# Patient Record
Sex: Female | Born: 1965 | Smoking: Current some day smoker
Health system: Southern US, Community
[De-identification: ages and names within clinical notes are randomized; demographics above are authoritative.]

## PROBLEM LIST (undated history)

## (undated) DIAGNOSIS — Z8619 Personal history of other infectious and parasitic diseases: Secondary | ICD-10-CM

## (undated) DIAGNOSIS — E079 Disorder of thyroid, unspecified: Secondary | ICD-10-CM

## (undated) HISTORY — DX: Disorder of thyroid, unspecified: E07.9

## (undated) HISTORY — DX: Personal history of other infectious and parasitic diseases: Z86.19

## (undated) HISTORY — PX: CHOLECYSTECTOMY: SHX55

---

## 2016-12-11 DIAGNOSIS — Z1231 Encounter for screening mammogram for malignant neoplasm of breast: Secondary | ICD-10-CM | POA: Diagnosis not present

## 2016-12-11 DIAGNOSIS — R319 Hematuria, unspecified: Secondary | ICD-10-CM | POA: Diagnosis not present

## 2016-12-11 DIAGNOSIS — Z1211 Encounter for screening for malignant neoplasm of colon: Secondary | ICD-10-CM | POA: Diagnosis not present

## 2016-12-11 DIAGNOSIS — Z0000 Encounter for general adult medical examination without abnormal findings: Secondary | ICD-10-CM | POA: Diagnosis not present

## 2016-12-11 DIAGNOSIS — E039 Hypothyroidism, unspecified: Secondary | ICD-10-CM | POA: Diagnosis not present

## 2016-12-18 DIAGNOSIS — Z1212 Encounter for screening for malignant neoplasm of rectum: Secondary | ICD-10-CM | POA: Diagnosis not present

## 2016-12-18 DIAGNOSIS — Z1211 Encounter for screening for malignant neoplasm of colon: Secondary | ICD-10-CM | POA: Diagnosis not present

## 2017-03-18 DIAGNOSIS — Z1231 Encounter for screening mammogram for malignant neoplasm of breast: Secondary | ICD-10-CM | POA: Diagnosis not present

## 2018-01-03 ENCOUNTER — Encounter: Payer: Self-pay | Admitting: Family Medicine

## 2018-01-03 ENCOUNTER — Ambulatory Visit: Payer: BLUE CROSS/BLUE SHIELD | Admitting: Family Medicine

## 2018-01-03 VITALS — BP 122/82 | HR 82 | Ht 61.0 in | Wt 185.2 lb

## 2018-01-03 DIAGNOSIS — M799 Soft tissue disorder, unspecified: Secondary | ICD-10-CM | POA: Diagnosis not present

## 2018-01-03 DIAGNOSIS — R2231 Localized swelling, mass and lump, right upper limb: Secondary | ICD-10-CM

## 2018-01-03 DIAGNOSIS — M7989 Other specified soft tissue disorders: Secondary | ICD-10-CM

## 2018-01-03 NOTE — Progress Notes (Signed)
Patient ID: Holly Brewer, female   DOB: [birth year], 52 y.o.   MRN: [id number]    Holly Brewer - 52 y.o. female MRN [id number]  Date of birth: 11/16/1965  Subjective Chief Complaint  Patient presents with  . Soft tissue mass    HPI Holly Brewer is a 52 y.o. female here today to establish care with new pcp.  She has a history of hypothyroidism but has otherwise been healthy.  She is concerned about soft tissue mass on RUE and abdomen.  She reports that several weeks ago she had an area on her right arm that reddened and somewhat raised.  After a couple of days this area flattened out.  This areas has become hardened and mildly tender to palpation since that time.  Last week she noticed a similar area on her abdomen.  She denies any drainage from these area, increased swelling, fever, chills, weight change or other symptoms. She was in the [employer] but denies known significant exposure to known carcinogens such as water contamination at [employer].    ROS: ROS completed and negative except as noted per HPI.    No Known Allergies  Past Medical History:  Diagnosis Date  . History of chicken pox   . Thyroid disease     Past Surgical History:  Procedure Laterality Date  . CHOLECYSTECTOMY      Social History   Socioeconomic History  . Marital status: Unknown    Spouse name: Not on file  . Number of children: Not on file  . Years of education: Not on file  . Highest education level: Not on file  Occupational History  . Not on file  Social Needs  . Financial resource strain: Not on file  . Food insecurity:    Worry: Not on file    Inability: Not on file  . Transportation needs:    Medical: Not on file    Non-medical: Not on file  Tobacco Use  . Smoking status: Never Smoker  . Smokeless tobacco: Never Used  Substance and Sexual Activity  . Alcohol use: Yes    Comment: occass.   . Drug use: Never  . Sexual activity: Not on file  Lifestyle  . Physical activity:    Days per  week: Not on file    Minutes per session: Not on file  . Stress: Not on file  Relationships  . Social connections:    Talks on phone: Not on file    Gets together: Not on file    Attends religious service: Not on file    Active member of club or organization: Not on file    Attends meetings of clubs or organizations: Not on file    Relationship status: Not on file  Other Topics Concern  . Not on file  Social History Narrative  . Not on file    Family History  Problem Relation Age of Onset  . Diabetes Mother   . Birth defects Daughter   . Diabetes Maternal Grandfather   . Arthritis Paternal Grandmother   . Cancer Paternal Grandfather     Health Maintenance  Topic Date Due  . Hepatitis C Screening  01-31-1966  . PAP SMEAR  07/15/1985  . MAMMOGRAM  07/15/2014  . COLONOSCOPY  07/15/2014  . HIV Screening  01/04/2019 (Originally 07/16/1979)  . INFLUENZA VACCINE  03/06/2018  . TETANUS/TDAP  08/06/2025    ----------------------------------------------------------------------------------------------------------------------------------------------------------------------------------------------------------------- Physical Exam BP 122/82   Pulse 82   Ht  (1.549 m)  Wt 185 lb 3.2 oz (84 kg)   LMP  (LMP Unknown)   BMI 34.99 kg/m   Physical Exam  Constitutional: She is oriented to person, place, and time. She appears well-nourished. No distress.  HENT:  Mouth/Throat: Oropharynx is clear and moist.  Eyes: No scleral icterus.  Neck: Neck supple. No thyromegaly present.  Cardiovascular: Normal rate, regular rhythm and normal heart sounds.  Pulmonary/Chest: Effort normal and breath sounds normal.  Lymphadenopathy:    She has no cervical adenopathy.  Neurological: She is alert and oriented to person, place, and time.  Skin:  Ill defined ~2-3 cm fixed soft tissue mass.  Area is firm in consistency without fluctuance.   Area on abdomen is smaller ~1-2cm but similar in  makeup.    Psychiatric: She has a normal mood and affect. Her behavior is normal.    ------------------------------------------------------------------------------------------------------------------------------------------------------------------------------------------------------------------- Assessment and Plan  Soft tissue mass Unclear etiology, I have ordered an ultrasound of the soft tissue to better clarify these areas May require MRI if [place] isn't definitive.    She will f/u for CPE and fasting labs, will address health maintenance at this visit.

## 2018-01-03 NOTE — Assessment & Plan Note (Signed)
Unclear etiology, I have ordered an ultrasound of the soft tissue to better clarify these areas May require MRI if [place] isn't definitive.

## 2018-01-03 NOTE — Patient Instructions (Addendum)
It was very nice to meet you! You will be contacted for ultrasound scheduling Schedule a CPE with fasting labs with me

## 2018-01-09 ENCOUNTER — Other Ambulatory Visit: Payer: Self-pay | Admitting: Family Medicine

## 2018-01-09 ENCOUNTER — Ambulatory Visit
Admission: RE | Admit: 2018-01-09 | Discharge: 2018-01-09 | Disposition: A | Discharge status: Home / Self Care | Payer: BLUE CROSS/BLUE SHIELD | Source: Ambulatory Visit | Attending: Family Medicine | Admitting: Family Medicine

## 2018-01-09 DIAGNOSIS — M7989 Other specified soft tissue disorders: Secondary | ICD-10-CM

## 2018-01-09 DIAGNOSIS — R1900 Intra-abdominal and pelvic swelling, mass and lump, unspecified site: Secondary | ICD-10-CM

## 2018-01-09 DIAGNOSIS — R2231 Localized swelling, mass and lump, right upper limb: Principal | ICD-10-CM

## 2018-01-09 NOTE — Progress Notes (Signed)
Ultrasound suspicious for lipoma in R arm but indeterminate.  US of area on abdomen is indeterminate as well.  We can watch these areas for now to see if they change in size or get an MRI for better clarification of these areas.

## 2018-01-10 NOTE — Addendum Note (Signed)
Addended by: Mammie LorenzoMATTHEWS, Shomari Matusik E on: 01/10/2018 04:30 PM   Modules accepted: Orders

## 2018-01-20 ENCOUNTER — Other Ambulatory Visit: Payer: BLUE CROSS/BLUE SHIELD

## 2018-03-20 ENCOUNTER — Telehealth: Payer: Self-pay | Admitting: Family Medicine

## 2018-03-20 NOTE — Telephone Encounter (Signed)
Spoke with the, offer an nurse visit next week or blood work today or tomorrow.   Pt stated [facility], so [place].   FYI

## 2018-03-20 NOTE — Telephone Encounter (Signed)
Yes, but should wait until next week so it can be read or have quantiferon done.

## 2018-03-20 NOTE — Telephone Encounter (Signed)
Ok for TB place, please advise?    Copied from CRM [phone]. Topic: General - Other >> Mar 20, 2018  8:32 AM Holly Brewer, Shalonda wrote: Reason for CRM: Patient is requesting a TB test for her new employment. Please advise

## 2018-04-23 DIAGNOSIS — B86 Scabies: Secondary | ICD-10-CM | POA: Diagnosis not present

## 2018-05-03 DIAGNOSIS — R21 Rash and other nonspecific skin eruption: Secondary | ICD-10-CM | POA: Diagnosis not present

## 2018-07-24 ENCOUNTER — Encounter: Payer: Self-pay | Admitting: Family Medicine

## 2018-07-24 ENCOUNTER — Ambulatory Visit: Payer: BLUE CROSS/BLUE SHIELD | Admitting: Family Medicine

## 2018-07-24 VITALS — BP 138/84 | HR 83 | Temp 98.6°F | Ht 61.0 in | Wt 193.0 lb

## 2018-07-24 DIAGNOSIS — J069 Acute upper respiratory infection, unspecified: Secondary | ICD-10-CM

## 2018-07-24 NOTE — Assessment & Plan Note (Signed)
  Symptomatic therapy suggested: push fluids, rest, gargle warm salt water, use vaporizer or mist prn and return office visit prn if symptoms persist or worsen. Lack of antibiotic effectiveness discussed with her. Call or return to clinic prn if these symptoms worsen or fail to improve as anticipated.  Paperwork completed for employment.  [facility].

## 2018-07-24 NOTE — Progress Notes (Signed)
Holly Canardlicia Mensinger - 52 y.o. female MRN [id number]  Date of birth: 09-29-1965  Subjective Chief Complaint  Patient presents with  . URI    HPI Holly Brewer is a 52 y.o. female who complains of congestion, sneezing, post nasal drip and dry cough for 5 days. She denies a history of chest pain, chills, fevers, myalgias, nausea, shortness of breath, vomiting, wheezing and sputum production and denies a history of asthma. Patient does smoke cigarettes.  She has not tried anything for treatment so far.     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  [facility]  ROS:  A comprehensive ROS was completed and negative except as noted per HPI   No Known Allergies  Past Medical History:  Diagnosis Date  . History of chicken pox   . Thyroid disease     Past Surgical History:  Procedure Laterality Date  . CHOLECYSTECTOMY      Social History   Socioeconomic History  . Marital status: Unknown    Spouse name: Not on file  . Number of children: Not on file  . Years of education: Not on file  . Highest education level: Not on file  Occupational History  . Not on file  Social Needs  . Financial resource strain: Not on file  . Food insecurity:    Worry: Not on file    Inability: Not on file  . Transportation needs:    Medical: Not on file    Non-medical: Not on file  Tobacco Use  . Smoking status: Current Some Day Smoker  . Smokeless tobacco: Never Used  Substance and Sexual Activity  . Alcohol use: Yes    Comment: occass.   . Drug use: Never  . Sexual activity: Not on file  Lifestyle  . Physical activity:    Days per week: Not on file    Minutes per session: Not on file  . Stress: Not on file  Relationships  . Social connections:    Talks on phone: Not on file    Gets together: Not on file    Attends religious service: Not on file    Active member of club or organization: Not on file    Attends meetings of clubs or organizations:  Not on file    Relationship status: Not on file  Other Topics Concern  . Not on file  Social History Narrative  . Not on file    Family History  Problem Relation Age of Onset  . Diabetes Mother   . Birth defects Daughter   . Diabetes Maternal Grandfather   . Arthritis Paternal Grandmother   . Cancer Paternal Grandfather     Health Maintenance  Topic Date Due  . Hepatitis C Screening  09-29-1965  . COLONOSCOPY  07/15/2014  . PAP SMEAR-Modifier  10/18/2017  . INFLUENZA VACCINE  11/04/2018 (Originally 03/06/2018)  . HIV Screening  01/04/2019 (Originally 07/16/1979)  . MAMMOGRAM  03/19/2019  . TETANUS/TDAP  08/06/2025    ----------------------------------------------------------------------------------------------------------------------------------------------------------------------------------------------------------------- Physical Exam BP 138/84   Pulse 83   Temp 98.6 F (37 C) (Oral)   Ht 5\' 1"  (1.549 m)   Wt 193 lb (87.5 kg)   LMP  (LMP Unknown)   SpO2 97%   BMI 36.47 kg/m   Physical Exam Constitutional:      Appearance: Normal appearance.  HENT:     Head: Normocephalic and atraumatic.     Right Ear: Tympanic membrane normal.  Left Ear: Tympanic membrane normal.     Nose: Congestion present.     Mouth/Throat:     Mouth: Mucous membranes are moist.  Eyes:     General: No scleral icterus. Cardiovascular:     Rate and Rhythm: Normal rate and regular rhythm.  Pulmonary:     Effort: Pulmonary effort is normal.     Breath sounds: Normal breath sounds.  Lymphadenopathy:     Cervical: No cervical adenopathy.  Skin:    General: Skin is warm and dry.     Findings: No rash.  Neurological:     General: No focal deficit present.     Mental Status: She is alert.  Psychiatric:        Mood and Affect: Mood normal.        Behavior: Behavior normal.      ------------------------------------------------------------------------------------------------------------------------------------------------------------------------------------------------------------------- Assessment and Plan  Viral upper respiratory tract infection  Symptomatic therapy suggested: push fluids, rest, gargle warm salt water, use vaporizer or mist prn and return office visit prn if symptoms persist or worsen. Lack of antibiotic effectiveness discussed with her. Call or return to clinic prn if these symptoms worsen or fail to improve as anticipated.  Paperwork completed for employment.  [facility] to her employer.

## 2018-11-15 IMAGING — US US EXTREM UP *R* LTD
1 series · 14 of 19 positions shown · non-contrast
Comparison: None

CLINICAL DATA: Soft tissue mass.

EXAM:
ULTRASOUND RIGHT UPPER EXTREMITY LIMITED
TECHNIQUE: Ultrasound examination of the upper extremity soft tissues was
performed in the area of clinical concern.

[Series 1: us extrem up *right* ltd · 0.04mm/px · 19 acquisitions, 14 frames shown]
[im 1/19]
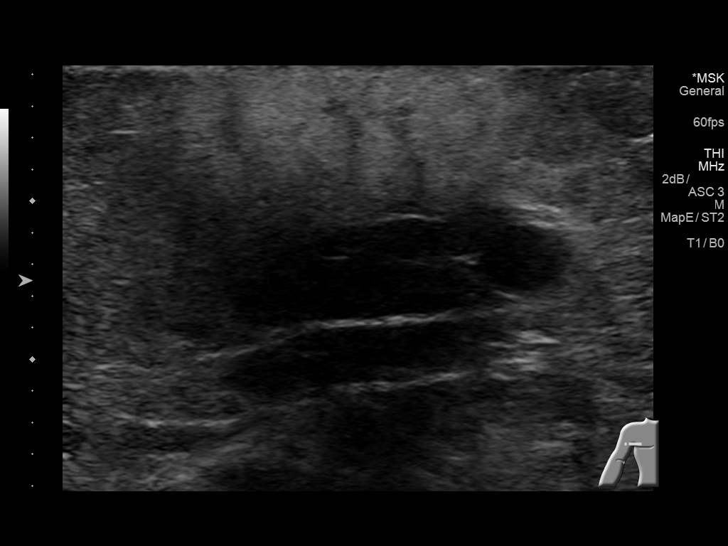
[im 3/19]
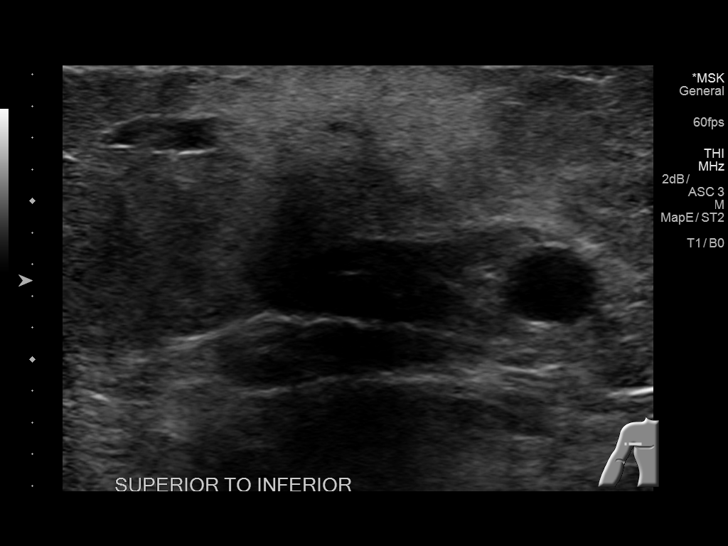
[im 4/19]
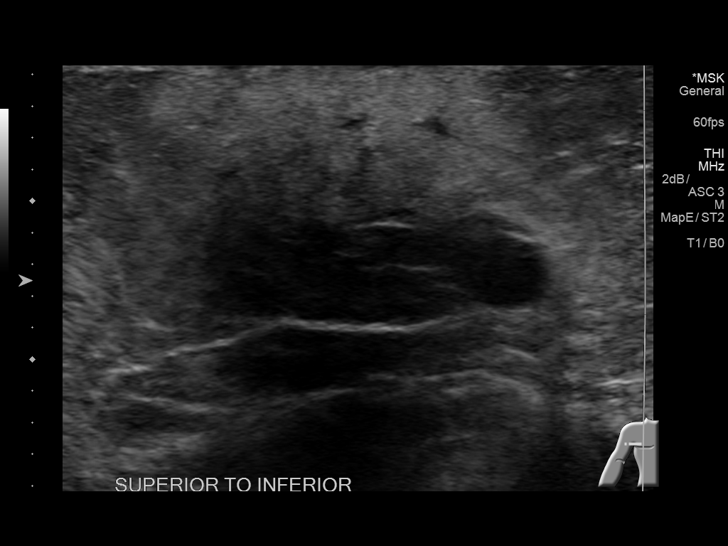
[im 5/19]
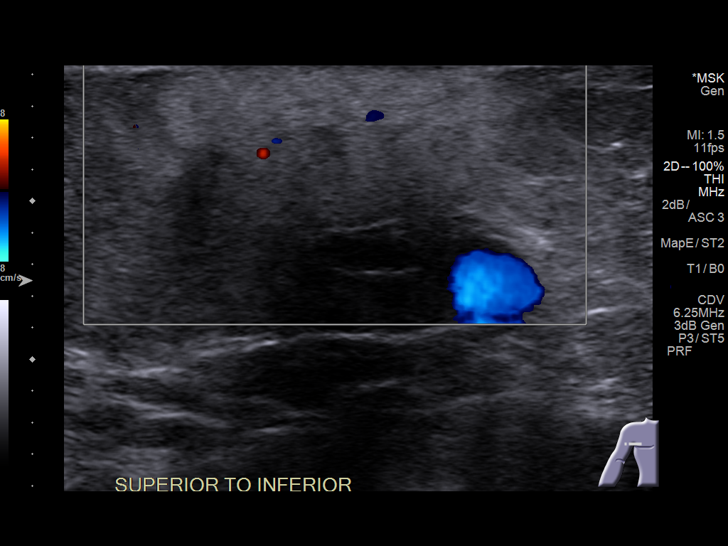
[im 7/19]
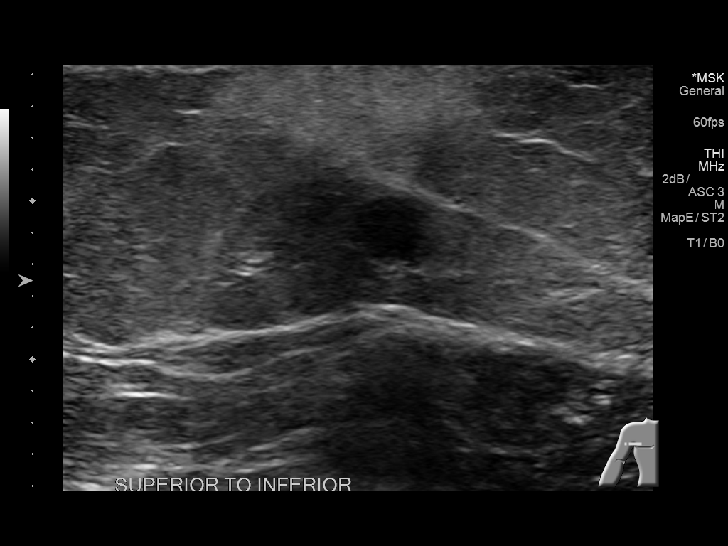
[im 8/19]
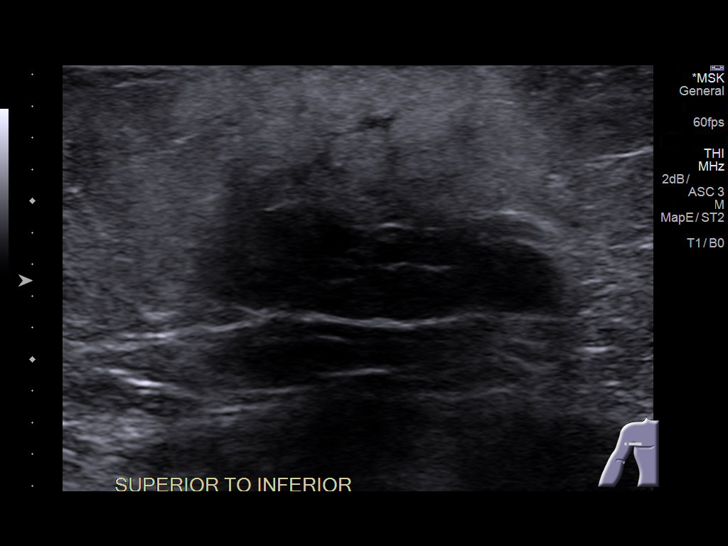
[im 9/19]
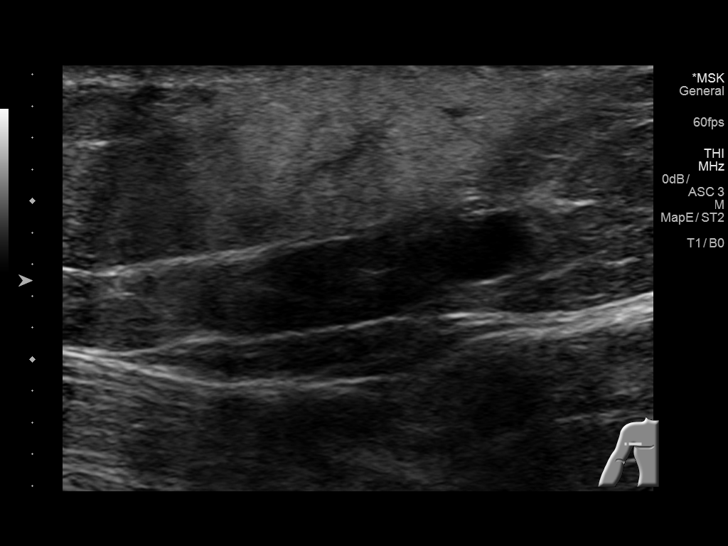
[im 11/19]
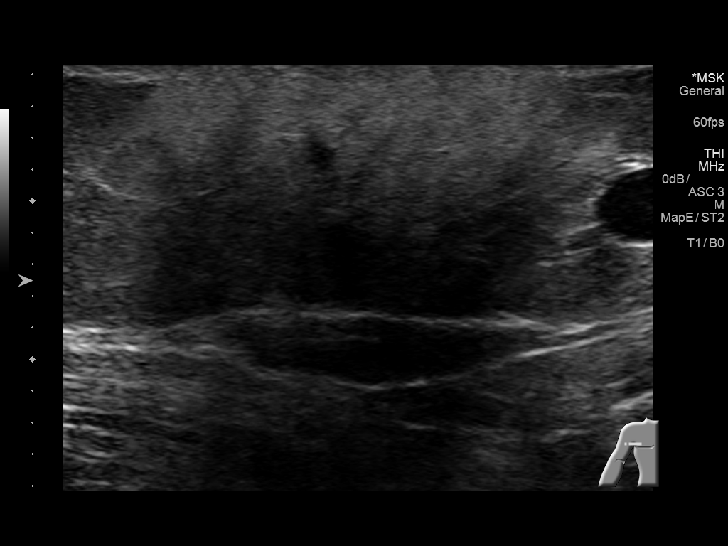
[im 12/19]
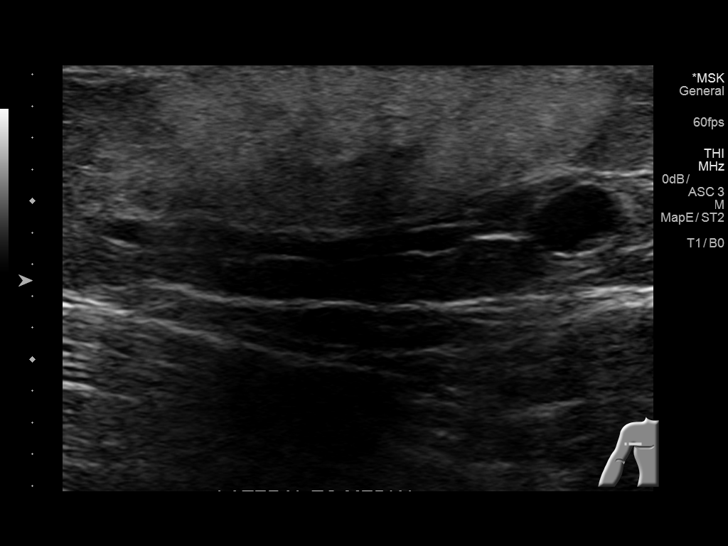
[im 13/19]
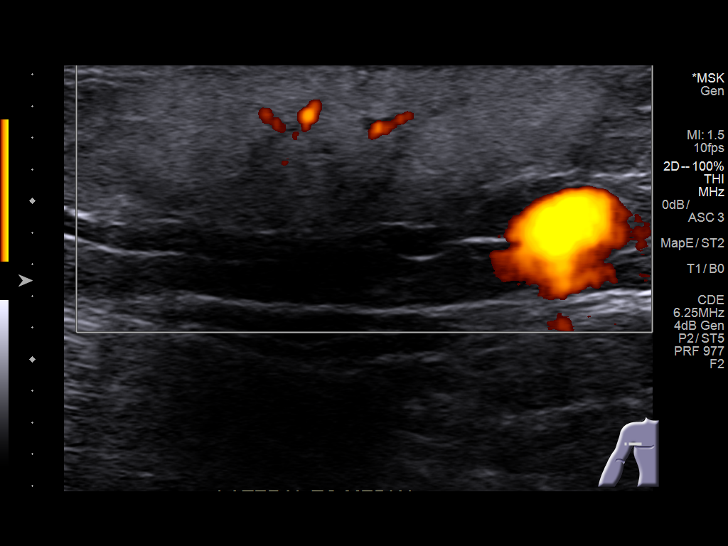
[im 15/19]
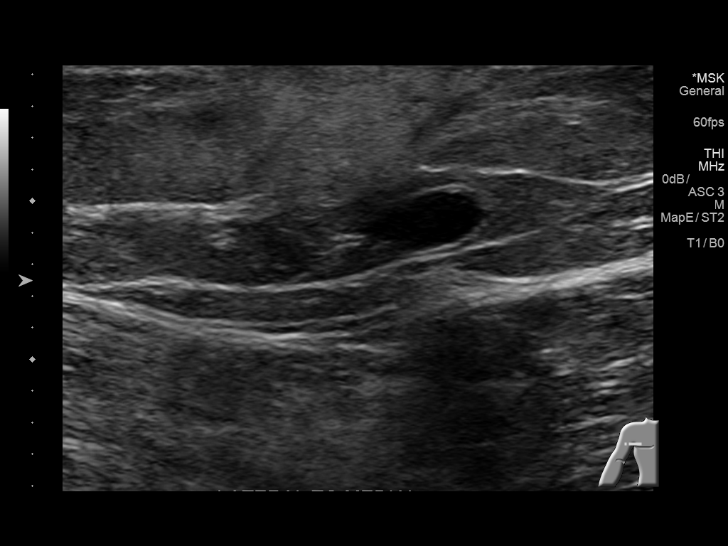
[im 16/19]
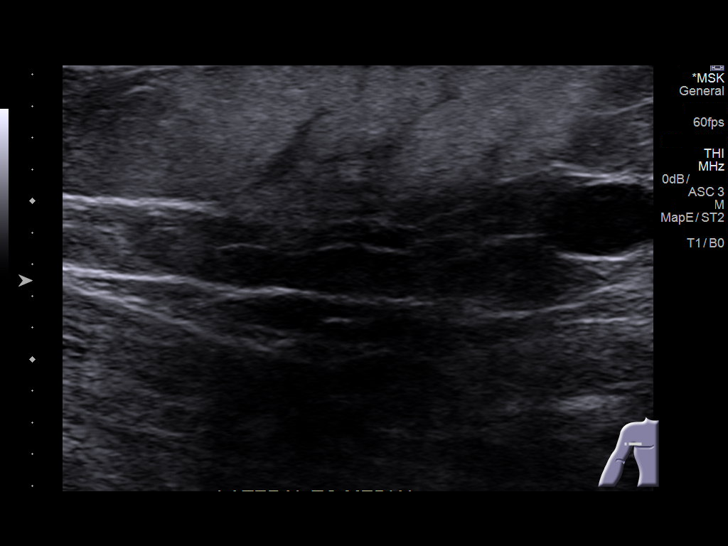
[im 17/19]
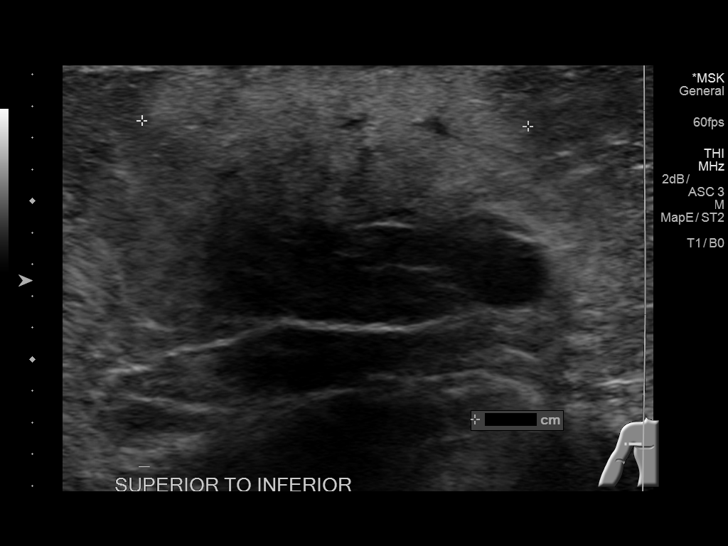
[im 19/19]
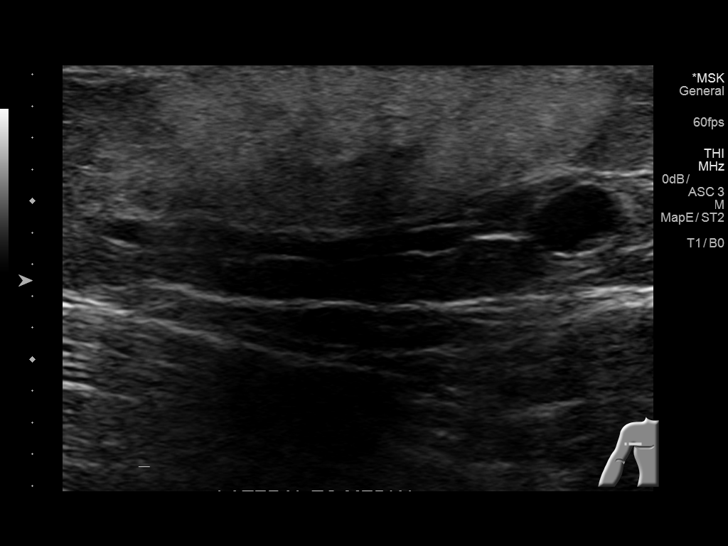

[14 of 19 positions shown; findings below may reference images not displayed]

FINDINGS: There is an indistinct, ill-defined primarily hyperechoic mass in
the region of the patient's symptoms, lateral to the brachial vein.
The mass measures 3.3 x 1.1 x 2.4 cm. The mass is soft to palpation.
IMPRESSION: The mass is nonspecific. However, the appearance would be good for a
lipoma.

## 2018-11-15 IMAGING — US US PELVIS LIMITED
1 series · 14 of 20 positions shown · non-contrast
Comparison: None.

CLINICAL DATA: Soft tissue mass in the right lower quadrant below
the umbilicus for 1 week.

EXAM:
US PELVIS LIMITED
TECHNIQUE: Ultrasound examination of the pelvic soft tissues was performed in
the area of clinical concern.

[Series 1: us pelvis limited · 0.05mm/px · 20 acquisitions, 14 frames shown]
[im 1/20]
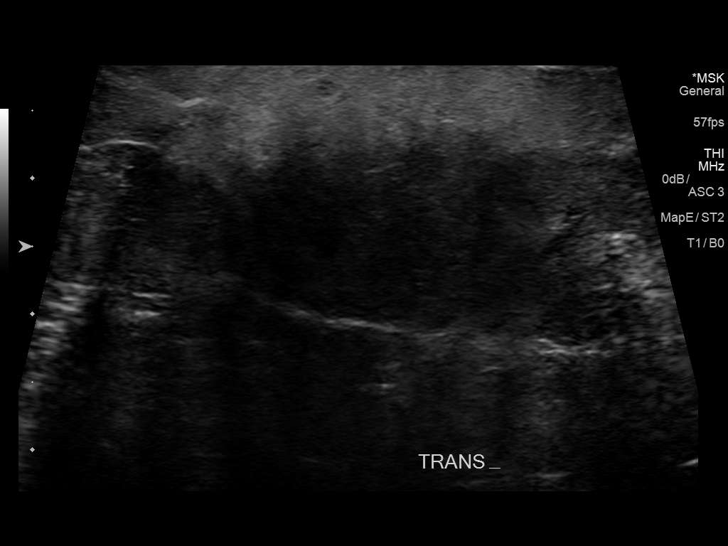
[im 3/20]
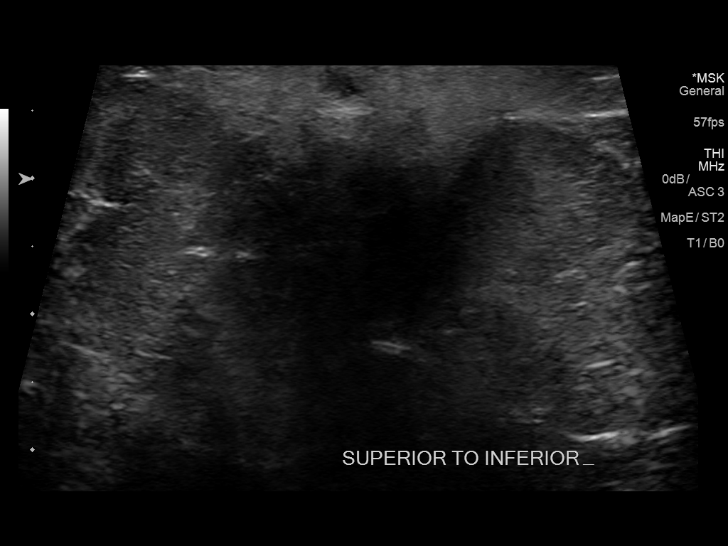
[im 4/20]
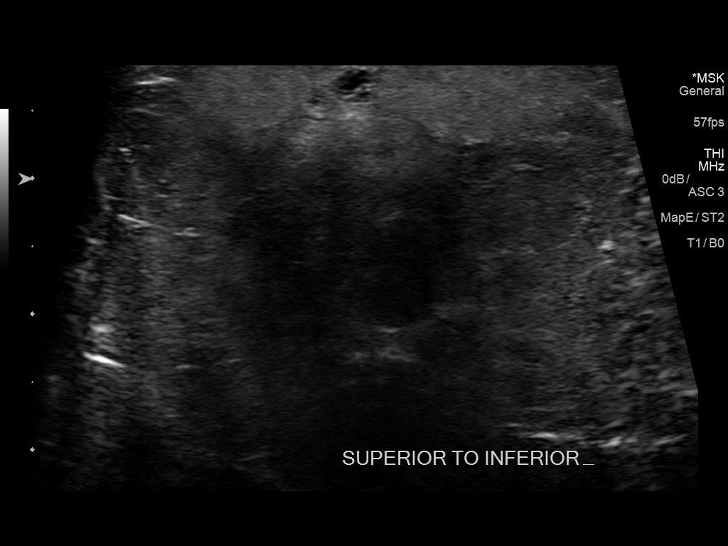
[im 6/20]
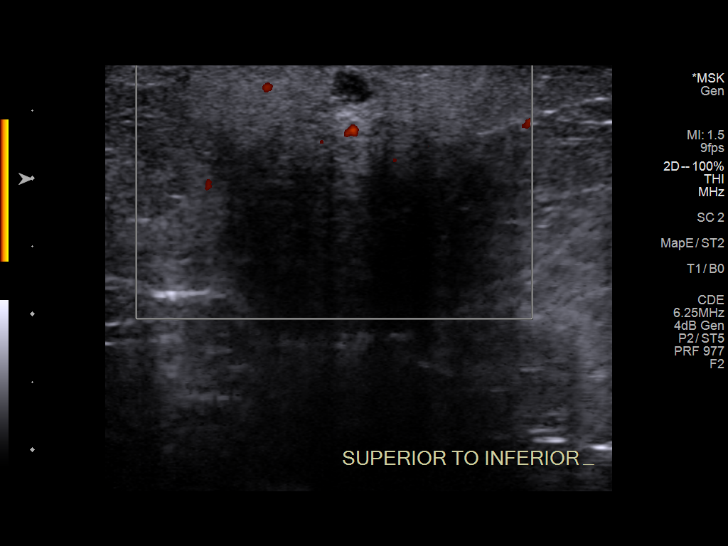
[im 7/20]
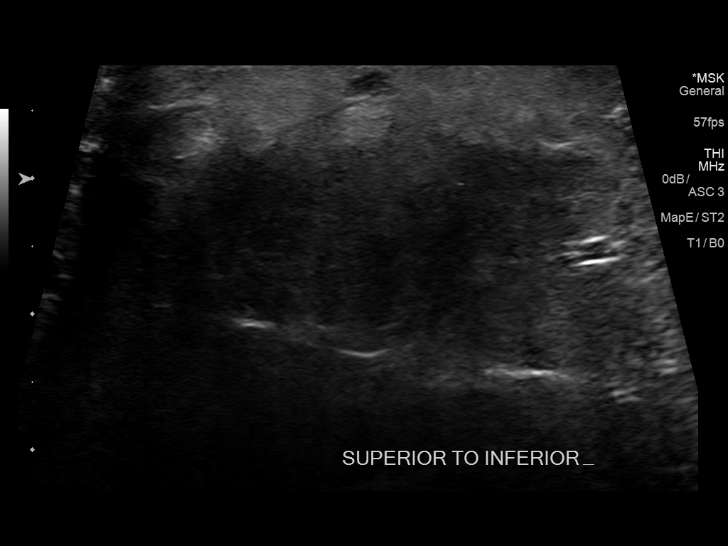
[im 8/20]
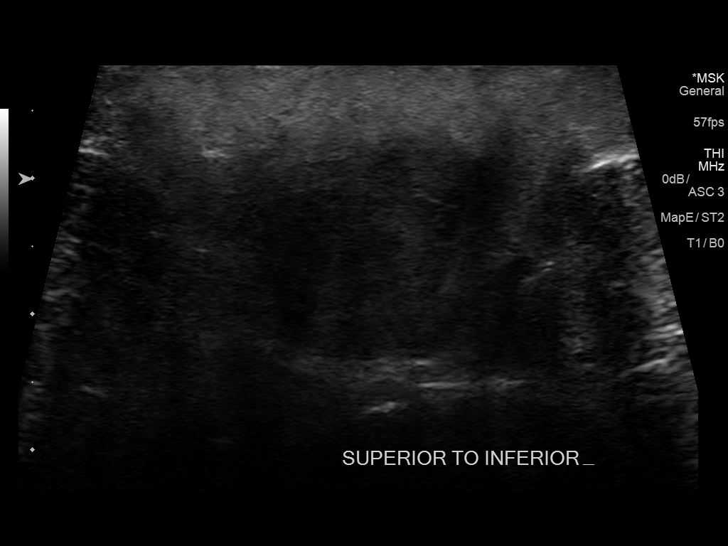
[im 10/20]
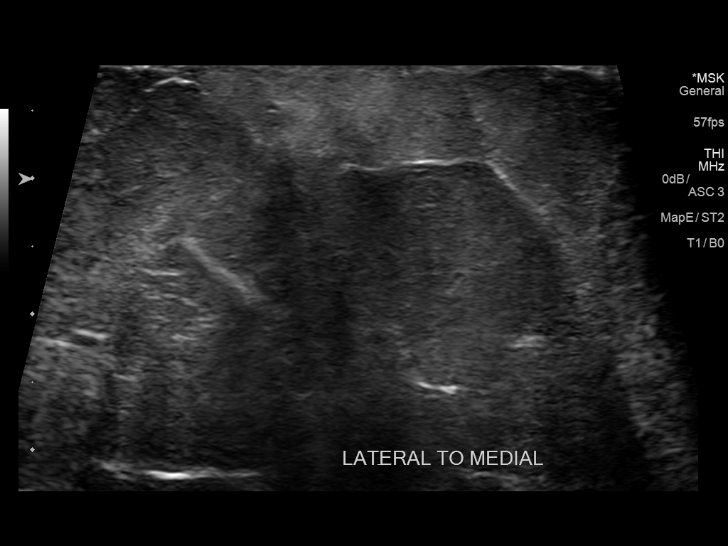
[im 11/20]
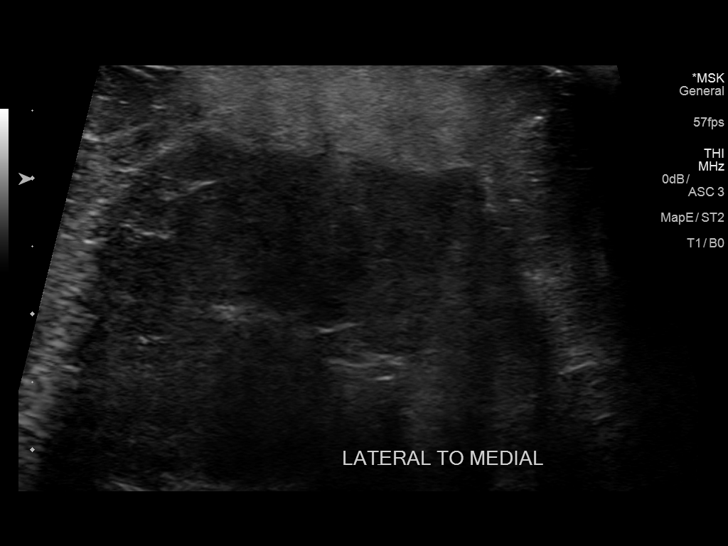
[im 13/20]
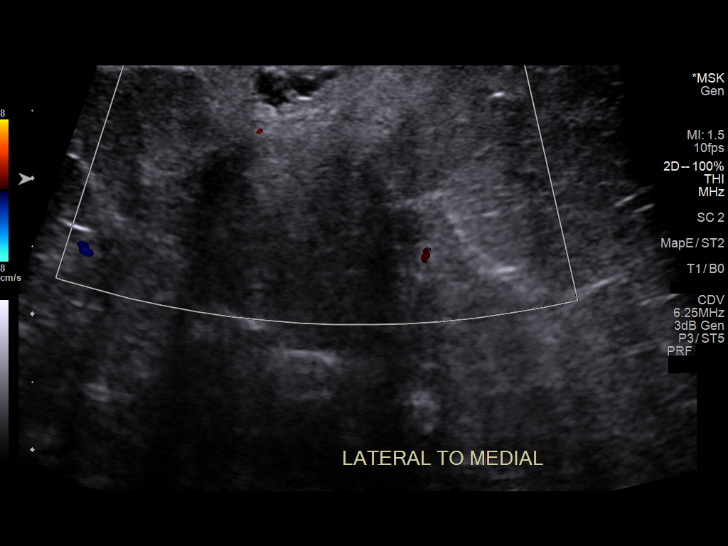
[im 14/20]
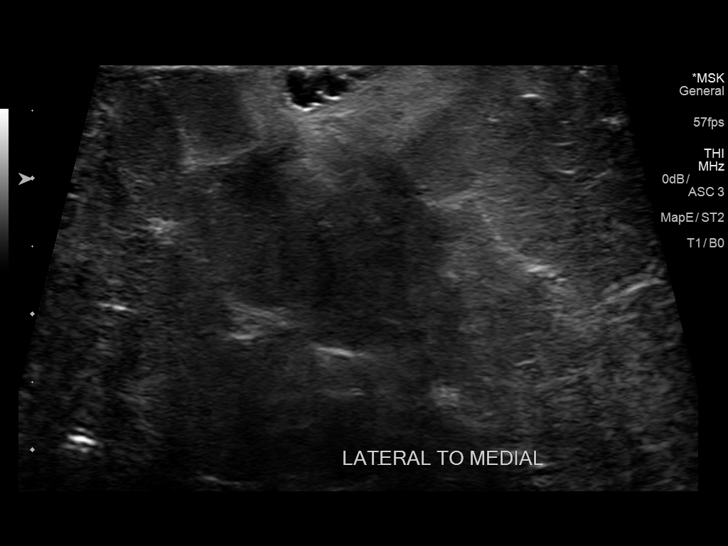
[im 16/20]
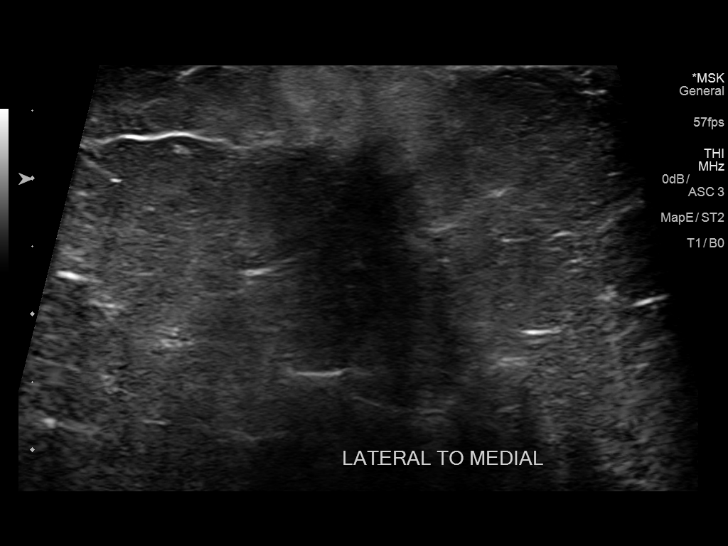
[im 17/20]
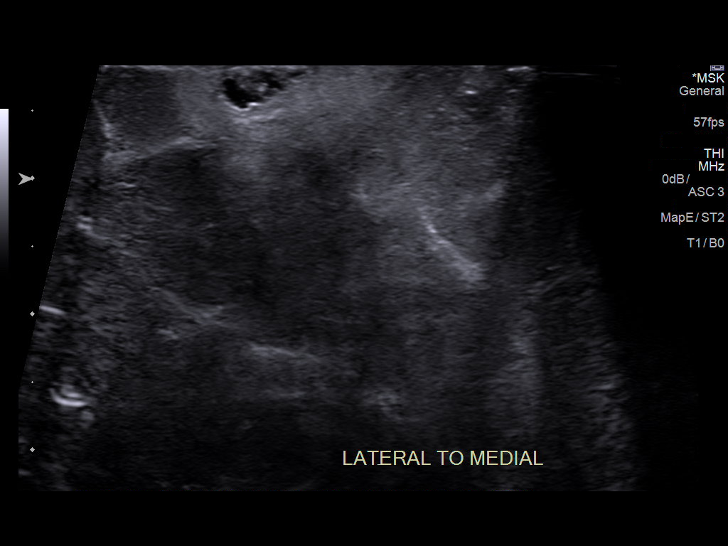
[im 18/20]
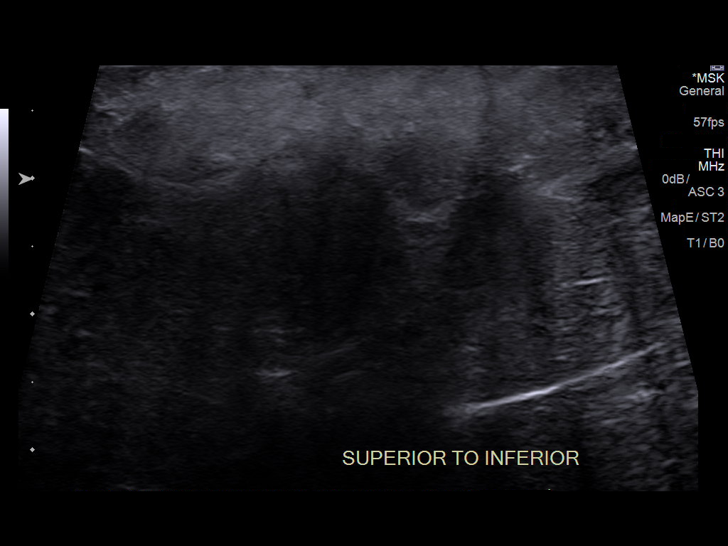
[im 20/20]
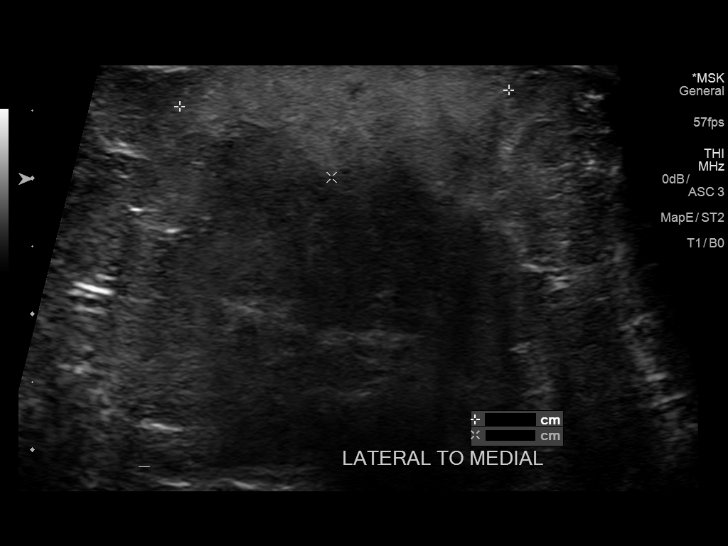

[14 of 20 positions shown; findings below may reference images not displayed]

FINDINGS: There is an indistinct soft oval hyperechoic ill-defined mass with a
central septated cystic region. The mass measures 2.4 x 1.0 x
cm.
IMPRESSION: There is a hyperechoic mass measuring 2.4 x 1.0 x 3.0 cm with a
central septated cystic component. The hyperechoic soft features
would be good for a lipoma. However, the central cystic component
would be somewhat unusual for a lipoma. The appearance would be good
for fat necrosis. However, the mass is nonspecific based on
ultrasound. MRI could further evaluate if clinically warranted.

## 2022-12-07 ENCOUNTER — Telehealth: Payer: Self-pay | Admitting: Family Medicine
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
## 2022-12-07 NOTE — Telephone Encounter (Signed)
She was seen at another practice that I was at and has never been seen here.   I am no longer her PCP due to it being >3 years since she was seen.  I would recommend that she establish with a new PCP.  She can establish as a new patient with one of our other providers who are taking new patients.

## 2022-12-07 NOTE — Telephone Encounter (Signed)
She should be able to follow up with her primary there.  Thanks!

## 2022-12-07 NOTE — Telephone Encounter (Signed)
Left message for a return call

## 2022-12-11 NOTE — Telephone Encounter (Signed)
Spoke with patient and informed . She states she already has a PCP with the [place] . She will call them with this information. She declined to schedule here.
